# Patient Record
Sex: Male | Born: 1995 | Race: Black or African American | Hispanic: No | Marital: Single | Smoking: Never smoker
Health system: Southern US, Community
[De-identification: ages and names within clinical notes are randomized; demographics above are authoritative.]

## PROBLEM LIST (undated history)

## (undated) DIAGNOSIS — J45909 Unspecified asthma, uncomplicated: Secondary | ICD-10-CM

---
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
## 2018-01-15 IMAGING — CT CT ABD-PELV W/ CM
2 of 4 series · 10 of 46 positions shown, 11 images · IV contrast (Iodine)
Comparison: None.

CLINICAL DATA: Right lower quadrant pain

EXAM:
CT ABDOMEN AND PELVIS WITH CONTRAST
TECHNIQUE: Multidetector CT imaging of the abdomen and pelvis was performed
using the standard protocol following bolus administration of
intravenous contrast.
CONTRAST:  100 mL [id number] intravenous

[Series 201: routine, idose (2) · axial · 0.78mm/px · z∈[-581,-166]mm · 7 of 101 slices shown, 8 images]
[im 9/101  soft-tissue]
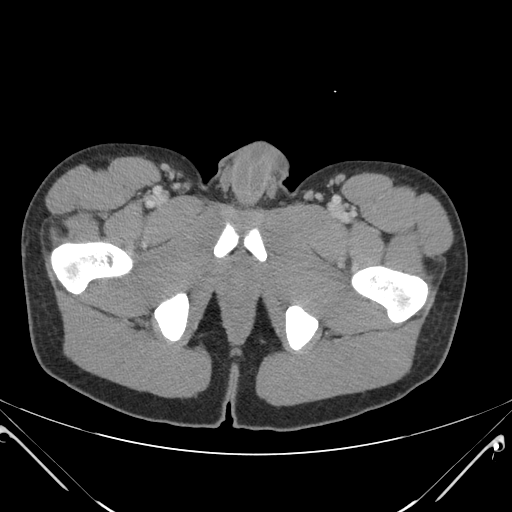
[im 9/101  bone]
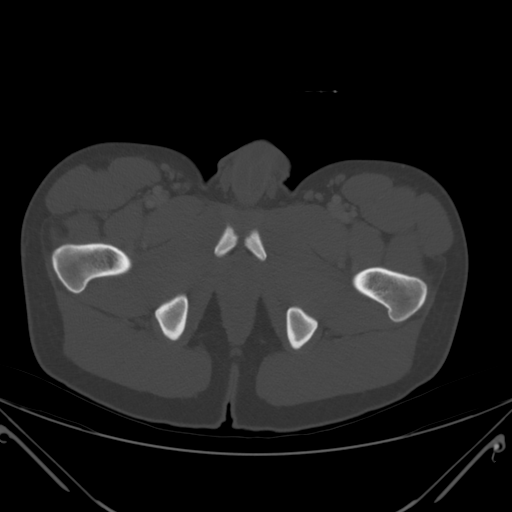
[im 21/101  soft-tissue]
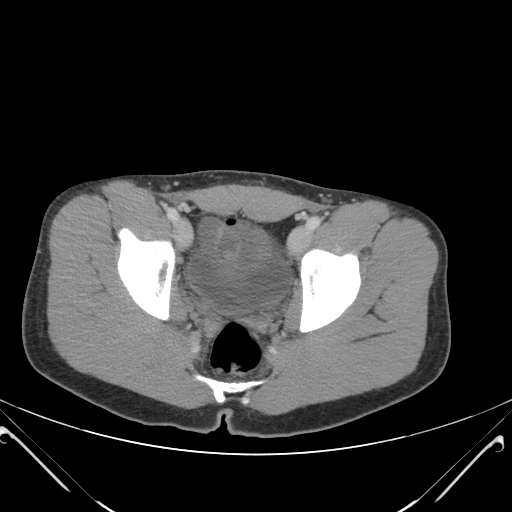
[im 38/101  soft-tissue]
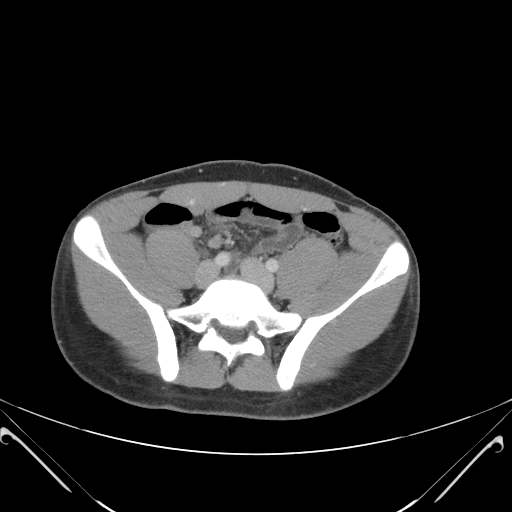
[im 51/101  soft-tissue]
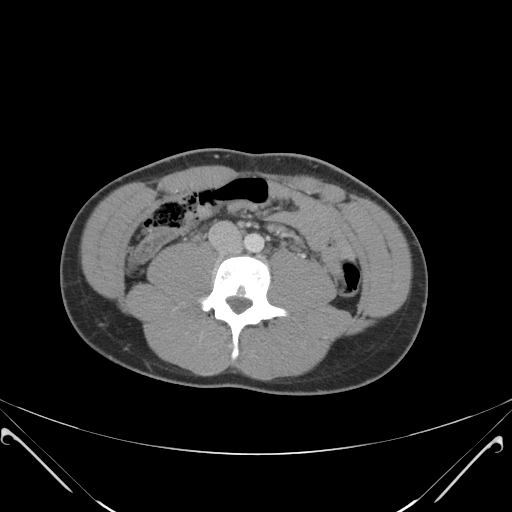
[im 63/101  soft-tissue]
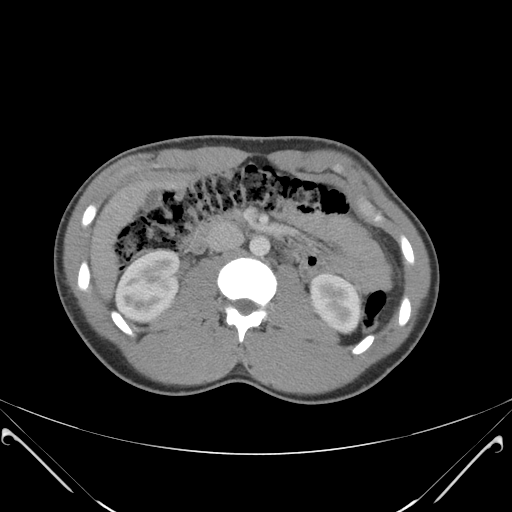
[im 80/101  soft-tissue]
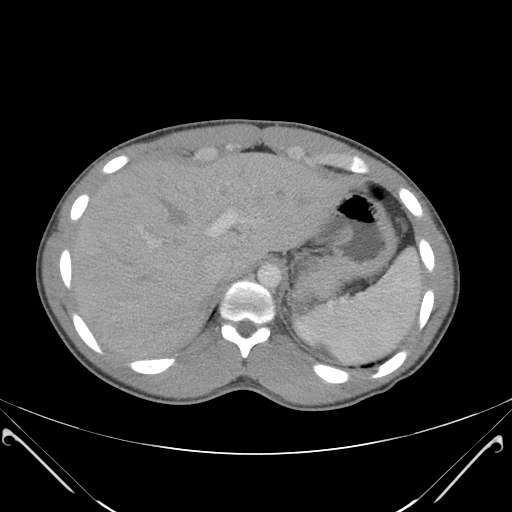
[im 92/101  soft-tissue]
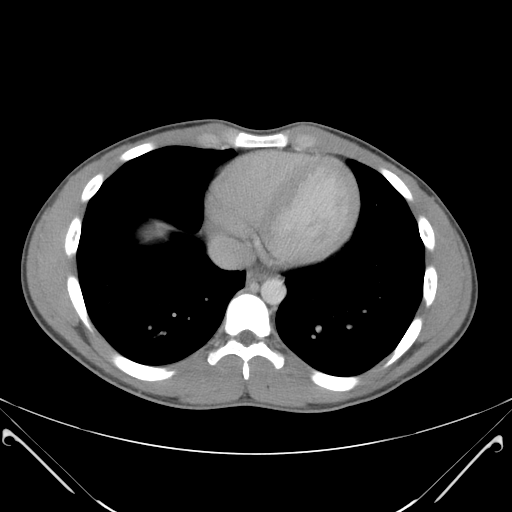

[Series 203: coronals, idose (2) · coronal · 0.45mm/px · 3 of 128 slices shown]
[im 43/128  soft-tissue]
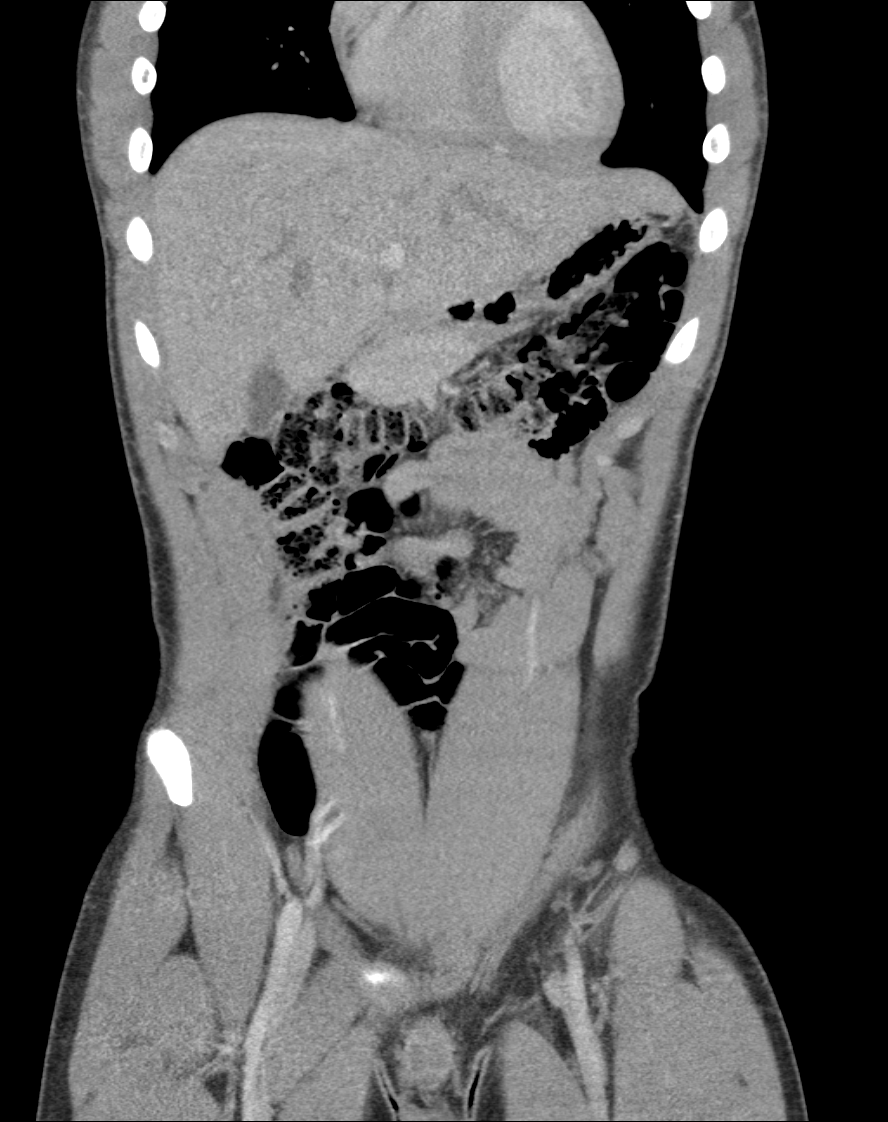
[im 57/128  soft-tissue]
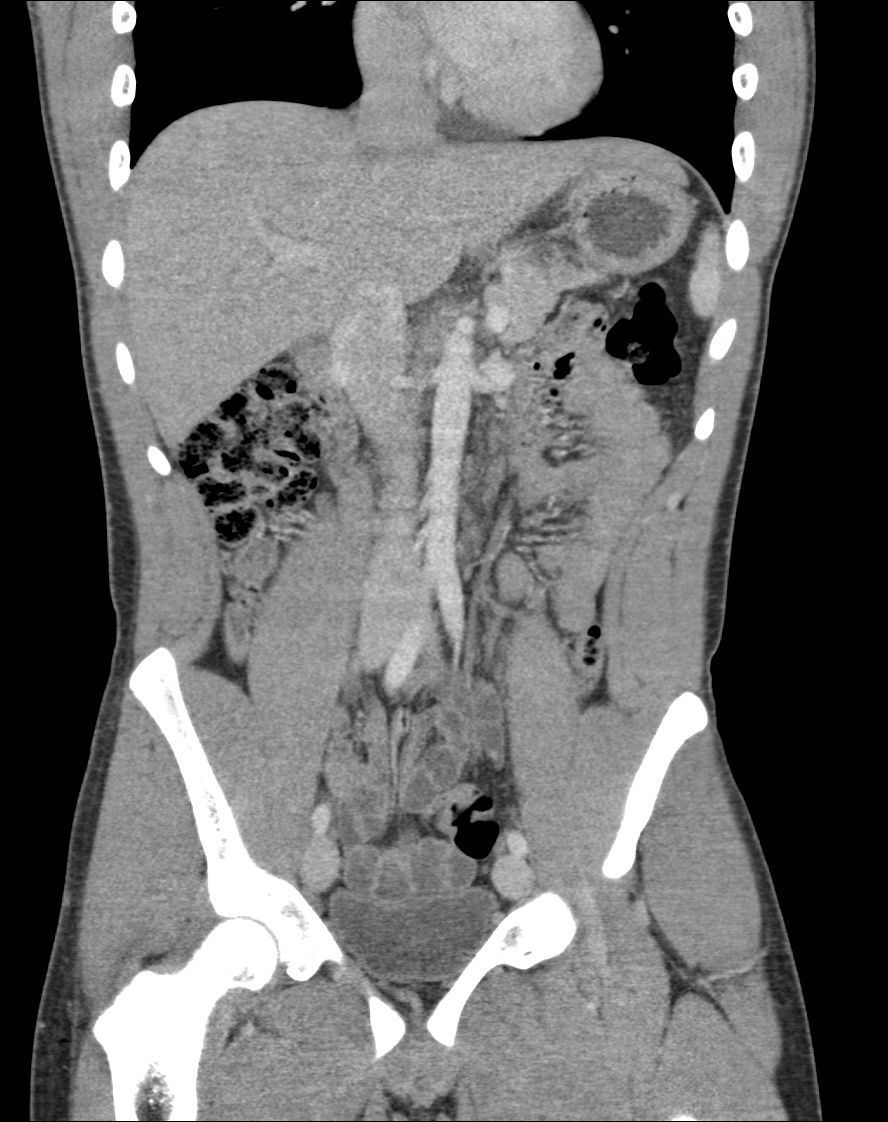
[im 71/128  soft-tissue]
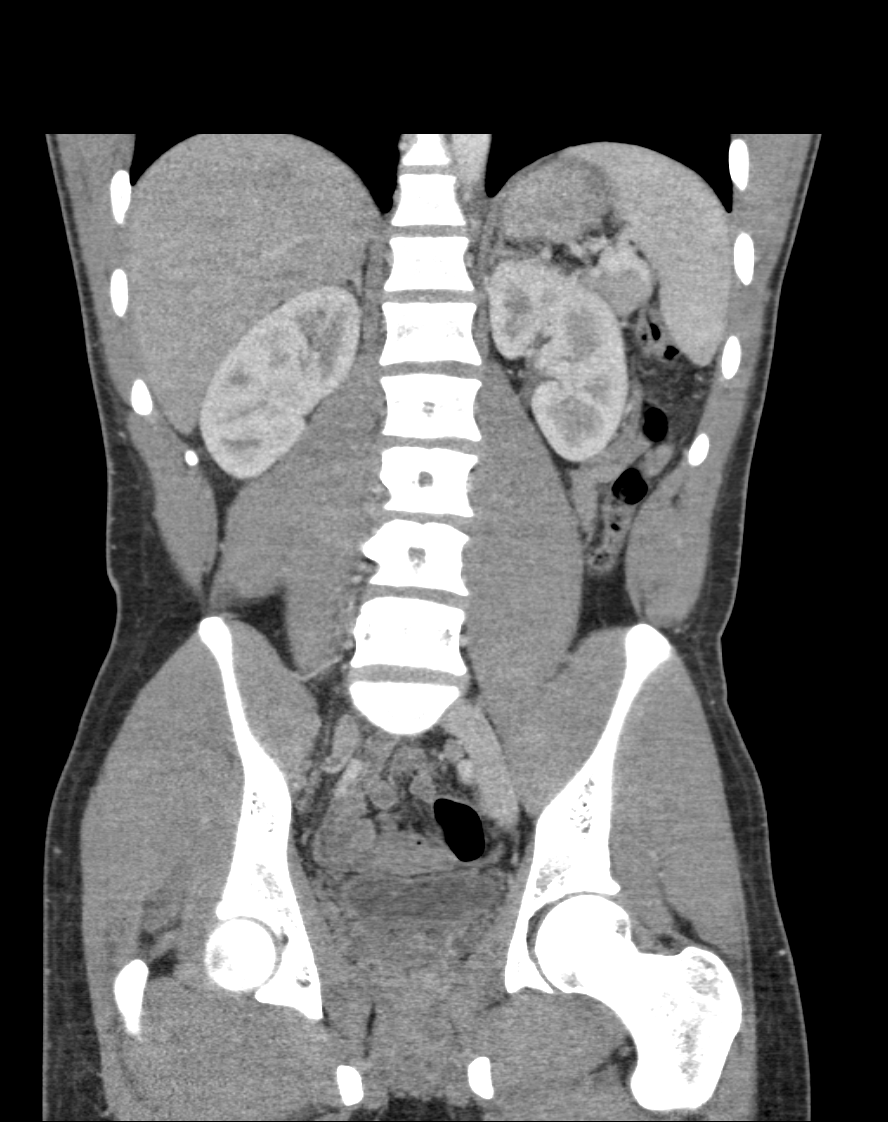

[10 of 46 positions shown; findings below may reference images not displayed]

FINDINGS: Lower chest: No acute abnormality.

Hepatobiliary: No focal liver abnormality is seen. No gallstones,
gallbladder wall thickening, or biliary dilatation.

Pancreas: Unremarkable. No pancreatic ductal dilatation or
surrounding inflammatory changes.

Spleen: Normal in size without focal abnormality.

Adrenals/Urinary Tract: Adrenal glands are unremarkable. Kidneys are
normal, without renal calculi, focal lesion, or hydronephrosis.
Bladder is unremarkable.

Stomach/Bowel: Stomach is within normal limits. Appendix appears
normal. No evidence of bowel wall thickening, distention, or
inflammatory changes.

Vascular/Lymphatic: No significant vascular findings are present. No
enlarged abdominal or pelvic lymph nodes.

Reproductive: Prostate is unremarkable.

Other: No abdominal wall hernia or abnormality. No abdominopelvic
ascites.

Musculoskeletal: No acute or significant osseous findings.
IMPRESSION: No CT evidence for acute intra-abdominal or pelvic pathology. Normal
appendix.
